# Patient Record
Sex: Male | Born: 1975 | Race: White | Hispanic: No | Marital: Single
Health system: Southern US, Community
[De-identification: ages and names within clinical notes are randomized; demographics above are authoritative.]

---

## 2015-07-23 ENCOUNTER — Encounter (HOSPITAL_COMMUNITY): Payer: Self-pay | Admitting: Emergency Medicine

## 2015-07-23 ENCOUNTER — Emergency Department (HOSPITAL_COMMUNITY): Payer: Self-pay

## 2015-07-23 ENCOUNTER — Emergency Department (HOSPITAL_COMMUNITY)
Admission: EM | Admit: 2015-07-23 | Discharge: 2015-07-23 | Disposition: A | Discharge status: Home / Self Care | Payer: Self-pay | Attending: Emergency Medicine | Admitting: Emergency Medicine

## 2015-07-23 DIAGNOSIS — R569 Unspecified convulsions: Secondary | ICD-10-CM | POA: Insufficient documentation

## 2015-07-23 LAB — CBC WITH DIFFERENTIAL/PLATELET
BASOS ABS: 0 10*3/uL (ref 0.0–0.1)
BASOS PCT: 0 %
EOS ABS: 0.1 10*3/uL (ref 0.0–0.7)
EOS PCT: 2 %
HCT: 28.9 % — ABNORMAL LOW (ref 39.0–52.0)
Hemoglobin: 9.5 g/dL — ABNORMAL LOW (ref 13.0–17.0)
Lymphocytes Relative: 12 %
Lymphs Abs: 0.3 10*3/uL — ABNORMAL LOW (ref 0.7–4.0)
MCH: 28 pg (ref 26.0–34.0)
MCHC: 32.9 g/dL (ref 30.0–36.0)
MCV: 85.3 fL (ref 78.0–100.0)
Monocytes Absolute: 0.2 10*3/uL (ref 0.1–1.0)
Monocytes Relative: 8 %
Neutro Abs: 2.3 10*3/uL (ref 1.7–7.7)
Neutrophils Relative %: 79 %
PLATELETS: 73 10*3/uL — AB (ref 150–400)
RBC: 3.39 MIL/uL — AB (ref 4.22–5.81)
RDW: 15.4 % (ref 11.5–15.5)
WBC: 2.9 10*3/uL — AB (ref 4.0–10.5)

## 2015-07-23 LAB — BASIC METABOLIC PANEL
ANION GAP: 7 (ref 5–15)
BUN: 15 mg/dL (ref 6–20)
CO2: 27 mmol/L (ref 22–32)
Calcium: 7.9 mg/dL — ABNORMAL LOW (ref 8.9–10.3)
Chloride: 103 mmol/L (ref 101–111)
Creatinine, Ser: 1.02 mg/dL (ref 0.61–1.24)
GFR calc Af Amer: >60 mL/min (ref >60)
Glucose, Bld: 94 mg/dL (ref 65–99)
POTASSIUM: 3.8 mmol/L (ref 3.5–5.1)
SODIUM: 137 mmol/L (ref 135–145)

## 2015-07-23 NOTE — Discharge Instructions (Signed)
Seizure, Adult A seizure is abnormal electrical activity in the brain. Seizures usually last from 30 seconds to 2 minutes. There are various types of seizures. Before a seizure, you may have a warning sensation (aura) that a seizure is about to occur. An aura may include the following symptoms:   Fear or anxiety.  Nausea.  Feeling like the room is spinning (vertigo).  Vision changes, such as seeing flashing lights or spots. Common symptoms during a seizure include:  A change in attention or behavior (altered mental status).  Convulsions with rhythmic jerking movements.  Drooling.  Rapid eye movements.  Grunting.  Loss of bladder and bowel control.  Bitter taste in the mouth.  Tongue biting. After a seizure, you may feel confused and sleepy. You may also have an injury resulting from convulsions during the seizure. HOME CARE INSTRUCTIONS   If you are given medicines, take them exactly as prescribed by your health care provider.  Keep all follow-up appointments as directed by your health care provider.  Do not swim or drive or engage in risky activity during which a seizure could cause further injury to you or others until your health care provider says it is OK.  Get adequate rest.  Teach friends and family what to do if you have a seizure. They should:  Lay you on the ground to prevent a fall.  Put a cushion under your head.  Loosen any tight clothing around your neck.  Turn you on your side. If vomiting occurs, this helps keep your airway clear.  Stay with you until you recover.  Know whether or not you need emergency care. SEEK IMMEDIATE MEDICAL CARE IF:  The seizure lasts longer than 5 minutes.  The seizure is severe or you do not wake up immediately after the seizure.  You have an altered mental status after the seizure.  You are having more frequent or worsening seizures. Someone should drive you to the emergency department or call local emergency  services (911 in [place] MAKE SURE YOU:  Understand these instructions.  Will watch your condition.  Will get help right away if you are not doing well or get worse.   This information is not intended to replace advice given to you by your health care provider. Make sure you discuss any questions you have with your health care provider.   Document Released: 09/18/2000 Document Revised: 10/12/2014 Document Reviewed: 05/03/2013 Elsevier Interactive Patient Education 2016 [employer]   DO NOT DRIVE OR OPERATE HEAVY MACHINERY UNTIL YOU SEE A PRIMARY CARE DOCTOR!

## 2015-07-23 NOTE — ED Notes (Signed)
Pt left with all belongings and ambulated out of treatment area.  

## 2015-07-23 NOTE — ED Provider Notes (Signed)
CSN: [id number]     Arrival date & time 07/23/15  2027 History   First MD Initiated Contact with Patient 07/23/15 2029     Chief Complaint  Patient presents with  . Seizures   Patient is a 39 y.o. male presenting with seizures.  Seizures Seizure activity on arrival: no   Seizure type:  Grand mal Preceding symptoms: aura   Preceding symptoms: no dizziness and no euphoria   Initial focality:  Diffuse Episode characteristics: abnormal movements and confusion   Postictal symptoms: confusion   Return to baseline: yes   Severity:  Moderate Timing:  Once Context: decreased sleep and drug use   Context: not alcohol withdrawal, not fever, not flashing visual stimuli, not possible hypoglycemia and not previous head injury   Recent head injury:  No recent head injuries PTA treatment:  None History of seizures: no     History reviewed. No pertinent past medical history. History reviewed. No pertinent past surgical history. History reviewed. No pertinent family history. Social History  Substance Use Topics  . Smoking status: None  . Smokeless tobacco: None  . Alcohol Use: None    Review of Systems  Constitutional: Negative for fever and fatigue.  Respiratory: Negative for shortness of breath.   Cardiovascular: Negative for chest pain.  Musculoskeletal: Negative for neck pain and neck stiffness.  Neurological: Positive for seizures. Negative for headaches.  All other systems reviewed and are negative.     Allergies  Review of patient's allergies indicates not on file.  Home Medications   Prior to Admission medications   Not on File   BP 121/75 mmHg  Pulse 93  Temp(Src) 97.7 F (36.5 C) (Oral)  SpO2 99% Physical Exam  Constitutional: He is oriented to person, place, and time. He appears well-developed and well-nourished. No distress.  HENT:  Head: Normocephalic and atraumatic.  Eyes: Conjunctivae are normal. Pupils are equal, round, and reactive to light.  Neck:  Normal range of motion. Neck supple.  Cardiovascular: Normal rate.   No murmur heard. Pulmonary/Chest: Effort normal. No respiratory distress. He has no wheezes.  Abdominal: Soft. He exhibits no distension. There is no tenderness. There is no rebound.  Musculoskeletal: Normal range of motion. He exhibits no edema.  Neurological: He is alert and oriented to person, place, and time. No cranial nerve deficit. Coordination normal.  Skin: Skin is warm and dry. He is not diaphoretic. No erythema.  Psychiatric: His behavior is normal. Thought content normal.  Nursing note and vitals reviewed.   ED Course  Procedures (including critical care time) Labs Review Labs Reviewed  CBC WITH DIFFERENTIAL/PLATELET - Abnormal; Notable for the following:    WBC 2.9 (*)    RBC 3.39 (*)    Hemoglobin 9.5 (*)    HCT 28.9 (*)    Platelets 73 (*)    Lymphs Abs 0.3 (*)    All other components within normal limits  BASIC METABOLIC PANEL - Abnormal; Notable for the following:    Calcium 7.9 (*)    All other components within normal limits  URINALYSIS, ROUTINE W REFLEX MICROSCOPIC (NOT AT [facility])  URINE RAPID DRUG SCREEN, HOSP PERFORMED    Imaging Review Ct Head Wo Contrast  07/23/2015  CLINICAL DATA:  EPISODE OF SEIZURE H/O DRUG ABUSE AND HIV EXAM: CT HEAD WITHOUT CONTRAST TECHNIQUE: Contiguous axial images were obtained from the base of the skull through the vertex without intravenous contrast. COMPARISON:  None. FINDINGS: Small retention cyst or polyp in the right maxillary  sinus. There is no evidence of acute intracranial hemorrhage, brain edema, mass lesion, acute infarction, mass effect, or midline shift. Acute infarct may be inapparent on noncontrast CT. No other intra-axial abnormalities are seen, and the ventricles and sulci are within normal limits in size and symmetry. No abnormal extra-axial fluid collections or masses are identified. No significant calvarial abnormality. IMPRESSION: 1. Negative for  bleed or other acute intracranial process Electronically Signed   By: Corlis Leak M.D.   On: 07/23/2015 21:36   I have personally reviewed and evaluated these images and lab results as part of my medical decision-making.  MDM   Patient presents emergency department today with first-time seizure. Patient had been speaking friend became extremely agitated. States that he is under a lot of stress andthat he was stuttering. At that time he lost consciousness and friend states that he was shaking. EMS found him to be postictal. His a small tongue laceration. Patient has no recent head trauma. 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  Patient is back to baseline. Patient refusing urinalysis or UDS. 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 CT unremarkable. We attempted to contact Infectious Disease to set up a referral for this patient (currently being seen at [facility] per patient) but patient left prior to [place] being able to refer him to neurology (for EEG) and for ID follow up (also left without discharge paperwork).  Final diagnoses:  Convulsions, unspecified convulsion type [facility])    Deirdre Peer, MD 07/24/15 8119  Dione Booze, MD 07/24/15 2300

## 2015-07-23 NOTE — ED Notes (Signed)
Pt stated that he was sitting in a car with a friend when he had a seizure. Pt stated that he started stuttering prior to the seizure. Friend called EMS. Upon EMS arrival, patient appeared to be in post-ictal state. Upon arrival to ER, patient CAO and answering questions appropriately. Pt with no history of seizures.

## 2016-07-11 IMAGING — CT CT HEAD W/O CM
1 series · 16 of 30 positions shown, 20 images · non-contrast
Comparison: None.

CLINICAL DATA: EPISODE OF SEIZURE H/O DRUG ABUSE AND HIV

EXAM:
CT HEAD WITHOUT CONTRAST
TECHNIQUE: Contiguous axial images were obtained from the base of the skull
through the vertex without intravenous contrast.

[Series 2: head 5.0 h30s · axial · 0.44mm/px · z∈[-129,+11]mm · 16 of 32 slices shown, 20 images]
[im 2/32  brain]
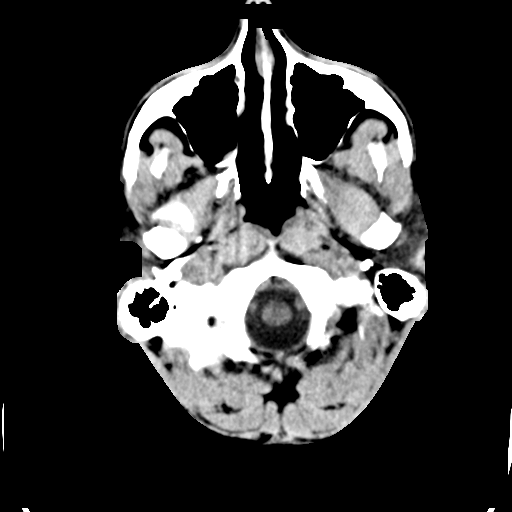
[im 2/32  bone]
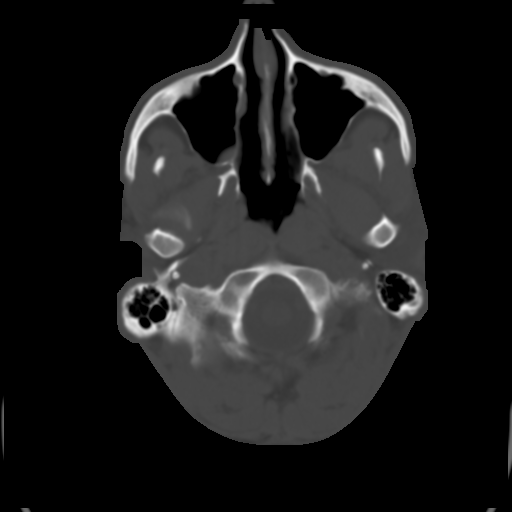
[im 4/32  brain]
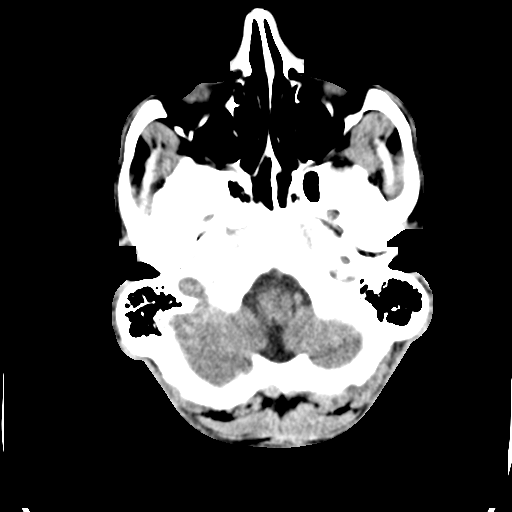
[im 6/32  brain]
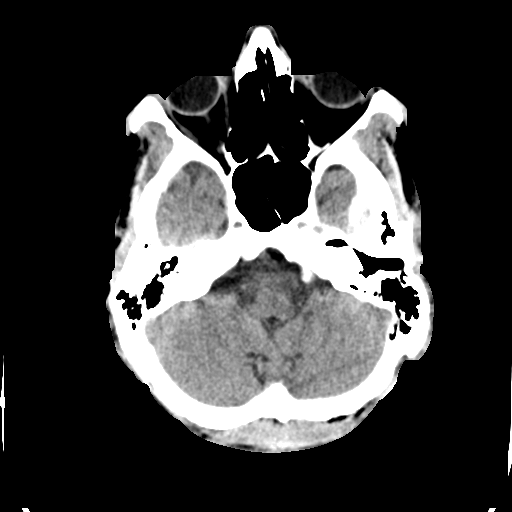
[im 8/32  brain]
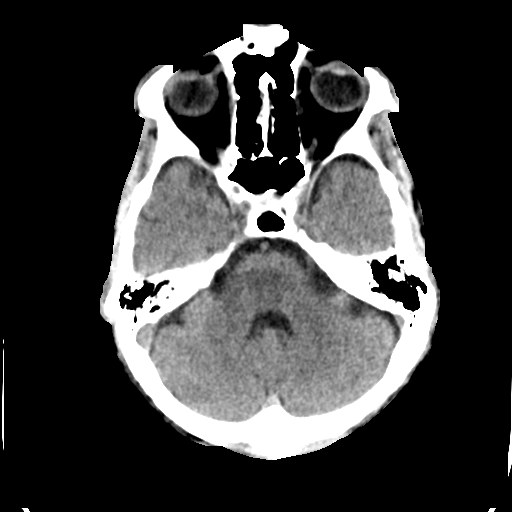
[im 9/32  brain]
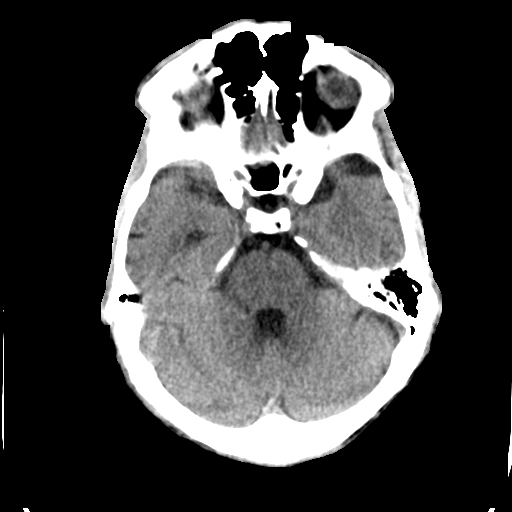
[im 9/32  bone]
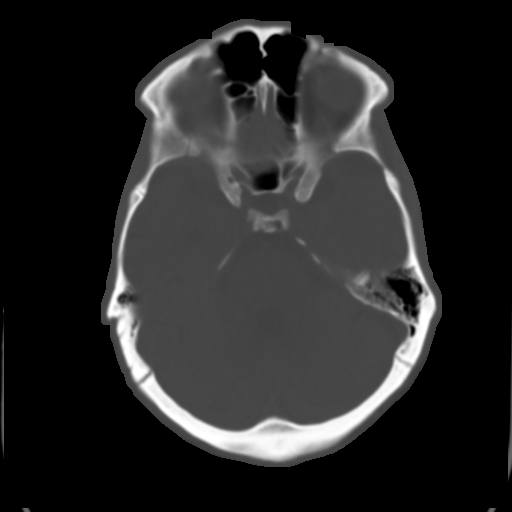
[im 11/32  brain]
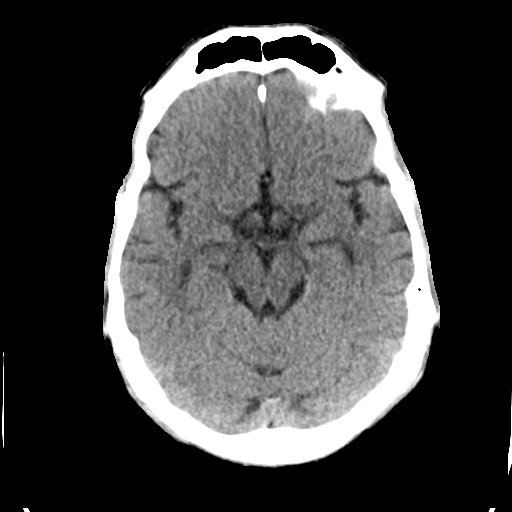
[im 13/32  brain]
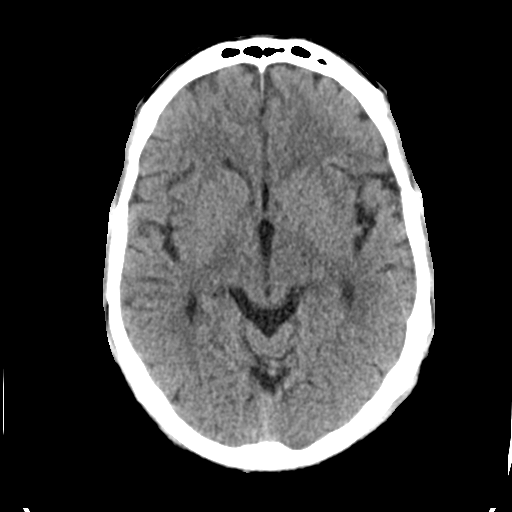
[im 15/32  brain]
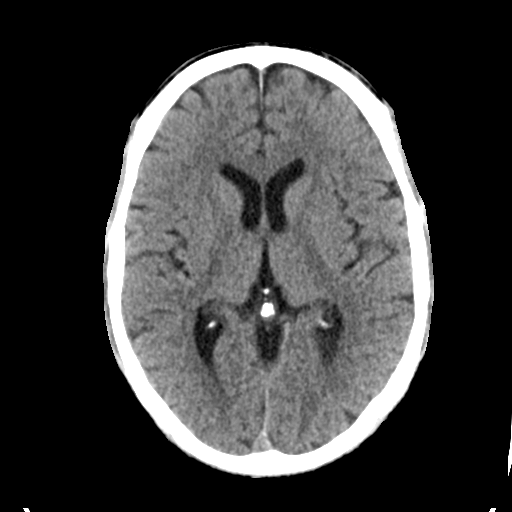
[im 17/32  brain]
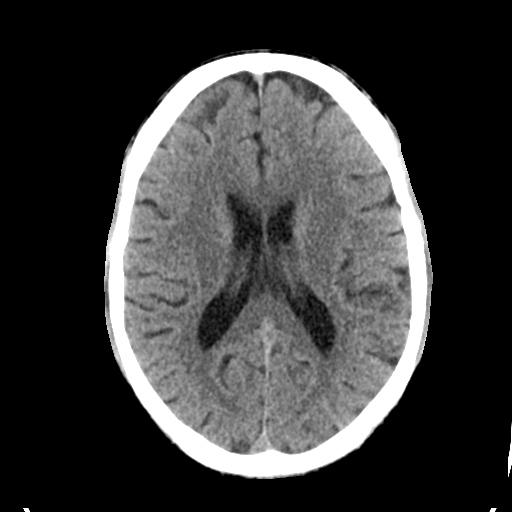
[im 17/32  bone]
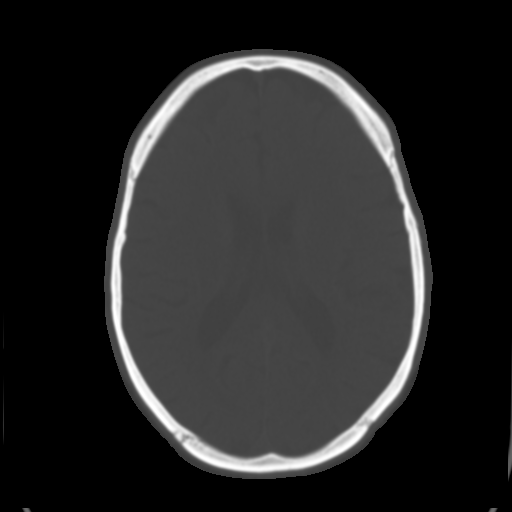
[im 19/32  brain]
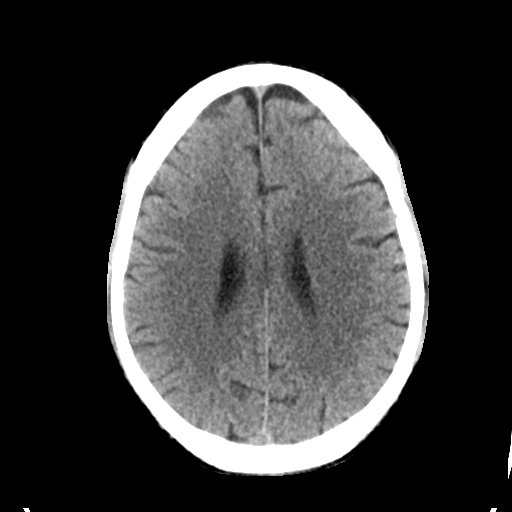
[im 21/32  brain]
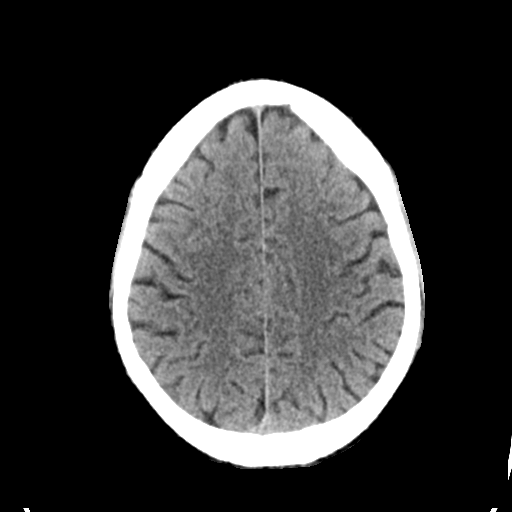
[im 23/32  brain]
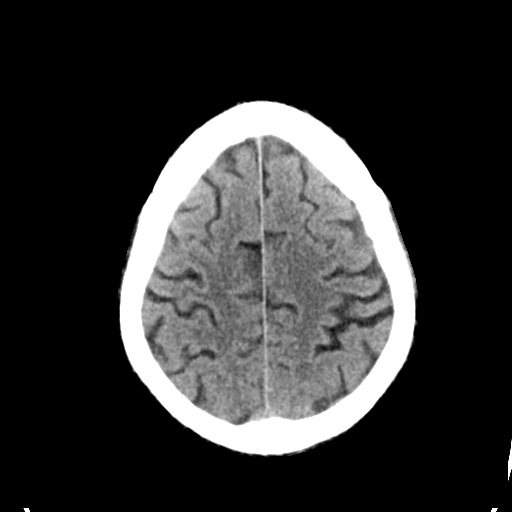
[im 24/32  brain]
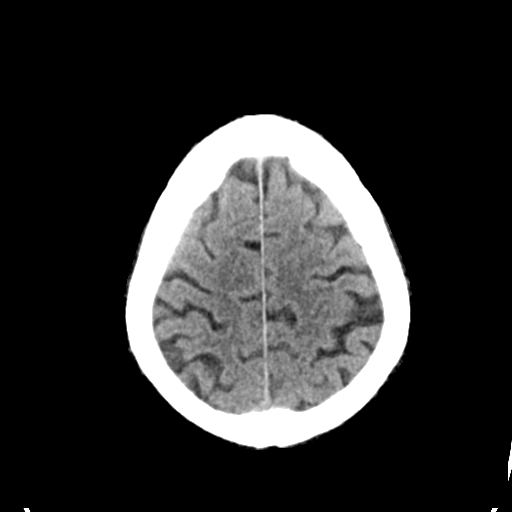
[im 24/32  bone]
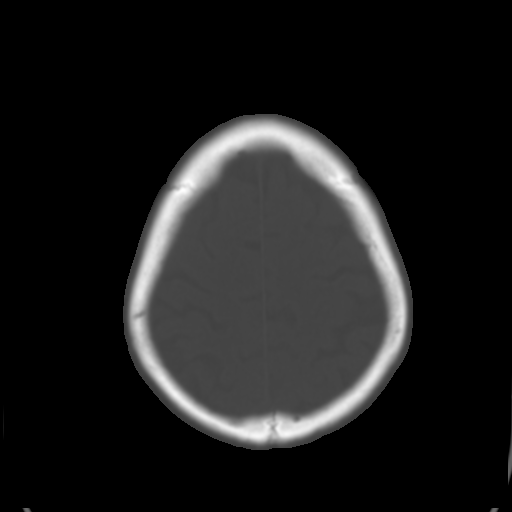
[im 26/32  brain]
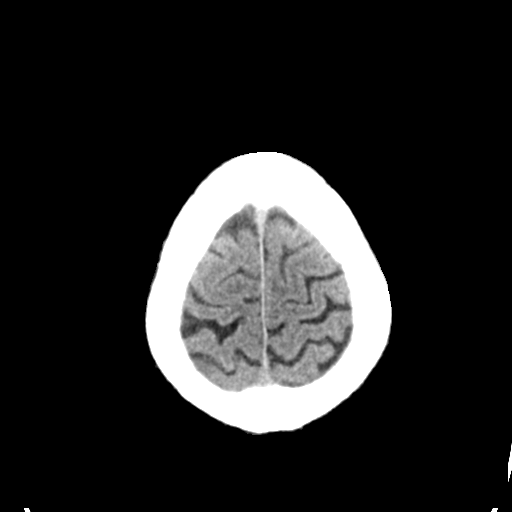
[im 28/32  brain]
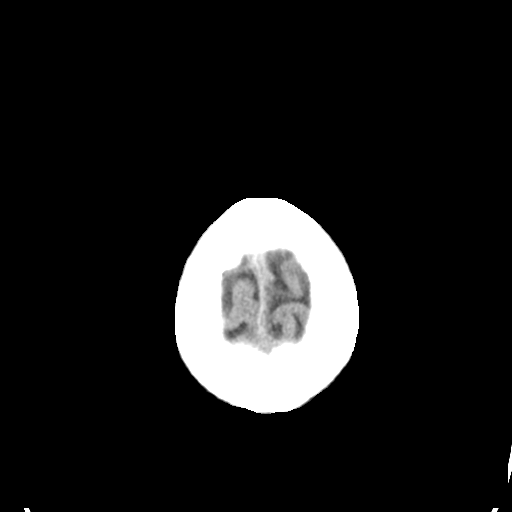
[im 30/32  brain]
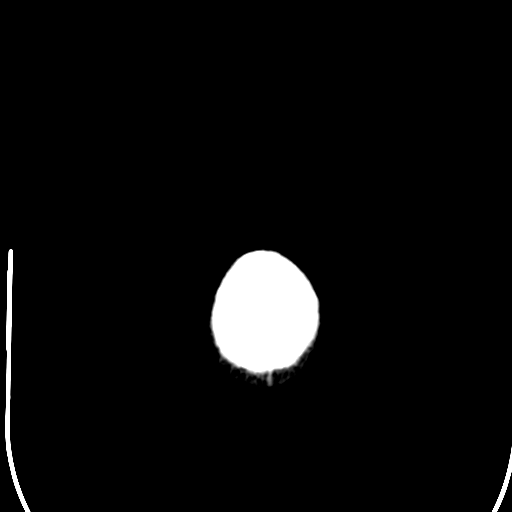

[16 of 30 positions shown; findings below may reference images not displayed]

FINDINGS: Small retention cyst or polyp in the right maxillary sinus. There is
no evidence of acute intracranial hemorrhage, brain edema, mass
lesion, acute infarction, mass effect, or midline shift. Acute
infarct may be inapparent on noncontrast CT. No other intra-axial
abnormalities are seen, and the ventricles and sulci are within
normal limits in size and symmetry. No abnormal extra-axial fluid
collections or masses are identified. No significant calvarial
abnormality.
IMPRESSION: 1. Negative for bleed or other acute intracranial process
